# Patient Record
Sex: Male | Born: 1945 | Hispanic: No | Marital: Single | Smoking: Never smoker
Health system: Southern US, Community
[De-identification: ages and names within clinical notes are randomized; demographics above are authoritative.]

## PROBLEM LIST (undated history)

## (undated) DIAGNOSIS — Z800 Family history of malignant neoplasm of digestive organs: Secondary | ICD-10-CM

## (undated) HISTORY — DX: Family history of malignant neoplasm of digestive organs: Z80.0

---

## 2018-06-26 ENCOUNTER — Other Ambulatory Visit (HOSPITAL_COMMUNITY): Payer: Self-pay | Admitting: Family Medicine

## 2018-06-26 ENCOUNTER — Other Ambulatory Visit: Payer: Self-pay | Admitting: Family Medicine

## 2018-06-26 DIAGNOSIS — N289 Disorder of kidney and ureter, unspecified: Secondary | ICD-10-CM

## 2018-06-26 DIAGNOSIS — K869 Disease of pancreas, unspecified: Secondary | ICD-10-CM

## 2018-06-30 ENCOUNTER — Other Ambulatory Visit (HOSPITAL_COMMUNITY): Payer: Self-pay | Admitting: Family Medicine

## 2018-06-30 ENCOUNTER — Ambulatory Visit (HOSPITAL_COMMUNITY)
Admission: RE | Admit: 2018-06-30 | Discharge: 2018-06-30 | Disposition: A | Discharge status: Home / Self Care | Payer: No Typology Code available for payment source | Source: Ambulatory Visit | Attending: Family Medicine | Admitting: Family Medicine

## 2018-06-30 DIAGNOSIS — K869 Disease of pancreas, unspecified: Secondary | ICD-10-CM | POA: Insufficient documentation

## 2018-06-30 DIAGNOSIS — N289 Disorder of kidney and ureter, unspecified: Secondary | ICD-10-CM | POA: Insufficient documentation

## 2018-06-30 MED ORDER — GADOBUTROL 1 MMOL/ML IV SOLN
9.0000 mL | Freq: Once | INTRAVENOUS | Status: AC | PRN
  Administered 2018-06-30: 9 mL via INTRAVENOUS

## 2018-07-08 ENCOUNTER — Ambulatory Visit (HOSPITAL_COMMUNITY)
Admission: RE | Admit: 2018-07-08 | Discharge: 2018-07-08 | Disposition: A | Discharge status: Home / Self Care | Payer: No Typology Code available for payment source | Source: Ambulatory Visit | Attending: Urology | Admitting: Urology

## 2018-07-08 ENCOUNTER — Other Ambulatory Visit (HOSPITAL_COMMUNITY): Payer: Self-pay | Admitting: Urology

## 2018-07-08 DIAGNOSIS — D4101 Neoplasm of uncertain behavior of right kidney: Secondary | ICD-10-CM

## 2018-07-09 ENCOUNTER — Telehealth: Payer: Self-pay | Admitting: Genetic Counselor

## 2018-07-09 NOTE — Telephone Encounter (Signed)
A genetic counseling appt has been scheduled for the pt on 2/3 at 8am. Pt wanted genetic counseling done asap bc he will be having surgery on his kidney. Francisco Mckinney is aware to arrive 15 minutes early to be checked in on time.

## 2018-07-11 ENCOUNTER — Telehealth: Payer: Self-pay | Admitting: Genetics

## 2018-07-11 NOTE — Telephone Encounter (Signed)
Pt cld back to reschedule appt to 4pm on 2/3

## 2018-07-14 ENCOUNTER — Inpatient Hospital Stay: Payer: Non-veteran care | Attending: Oncology | Admitting: Genetics

## 2018-07-14 ENCOUNTER — Other Ambulatory Visit: Payer: Non-veteran care

## 2018-07-14 DIAGNOSIS — Z315 Encounter for genetic counseling: Secondary | ICD-10-CM | POA: Diagnosis not present

## 2018-07-14 DIAGNOSIS — Z7183 Encounter for nonprocreative genetic counseling: Secondary | ICD-10-CM

## 2018-07-14 DIAGNOSIS — Z800 Family history of malignant neoplasm of digestive organs: Secondary | ICD-10-CM | POA: Diagnosis not present

## 2018-07-15 ENCOUNTER — Encounter: Payer: Self-pay | Admitting: Genetics

## 2018-07-15 DIAGNOSIS — Z800 Family history of malignant neoplasm of digestive organs: Secondary | ICD-10-CM | POA: Insufficient documentation

## 2018-07-15 NOTE — Progress Notes (Signed)
REFERRING PROVIDER: Henreitta Cea, MD No address on file  PRIMARY PROVIDER:  [place] VISIT:  1. Family history of pancreatic cancer     HISTORY OF PRESENT ILLNESS:   Francisco Mckinney, a 73 y.o. male, was seen for a [facility] cancer genetics consultation at the request of Dr. Arletha Grippe Mckinney to a family history of cancer.  Francisco Mckinney presents to clinic today to discuss the possibility of a hereditary predisposition to cancer, genetic testing, and to further clarify his future cancer risks, as well as potential cancer risks for family members.   Francisco Mckinney is a 73 y.o. male with no personal history of cancer.  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   Colonoscopy: yes; 2 years ago, no polyps. Mckinney for next in 2022.  Past Medical History:  Diagnosis Date  . Family history of pancreatic cancer   . Family history of pancreatic cancer     Social History   Socioeconomic History  . Marital status: Single    Spouse name: Not on file  . Number of children: Not on file  . Years of education: Not on file  . Highest education level: Not on file  Occupational History  . Not on file  Social Needs  . Financial resource strain: Not on file  . Food insecurity:    Worry: Not on file    Inability: Not on file  . Transportation needs:    Medical: Not on file    Non-medical: Not on file  Tobacco Use  . Smoking status: Not on file  Substance and Sexual Activity  . Alcohol use: Not on file  . Drug use: Not on file  . Sexual activity: Not on file  Lifestyle  . Physical activity:    Days per week: Not on file    Minutes per session: Not on file  . Stress: Not on file  Relationships  . Social connections:    Talks on phone: Not on file    Gets together: Not on file    Attends religious service: Not on file    Active member of club or organization: Not on file    Attends meetings of clubs or  organizations: Not on file    Relationship status: Not on file  Other Topics Concern  . Not on file  Social History Narrative  . Not on file     FAMILY HISTORY:  We obtained a detailed, 4-generation family history.  Significant diagnoses are listed below: Family History  Problem Relation Age of Onset  . Pancreatic cancer Mother 6  . Other Brother        cyst in pancreas  . Pancreatic cancer Maternal Grandmother 5   Francisco Mckinney has a 40 year-old son.  He has 2 brothers (1 is deceased) and 1 sister (62) with no hx of cancer.   Francisco Mckinney father: died at 5 with no hx of cancer.  Paternal Aunts/Uncles: 3 paternal uncles with no hx of cancer.  Paternal cousins: no hx of cancer.  Paternal grandfather: died at 31 Mckinney to the flue.  Paternal grandmother:no hx of cancer.   Francisco Mckinney mother: died at 22 with pancreatic cancer.  Maternal Aunts/Uncles: 1 maternal aunt with no hx of cancer.  Maternal cousins: no hx of cancer (3 cousins) Maternal grandfather: died young, no known hx of cancer.  Maternal grandmother:died in her early 68's with pancreatic  cancer.   Francisco Mckinney is unaware of previous family history of genetic testing for hereditary cancer risks. Patient's maternal ancestors are of Caucasian descent, and paternal ancestors are of [place] descent. There is no reported Ashkenazi Jewish ancestry. There is no known consanguinity.  GENETIC COUNSELING ASSESSMENT: Francisco Mckinney is a 73 y.o. male with a family history which is somewhat suggestive of a Hereditary Cancer Predisposition Syndrome. We, therefore, discussed and recommended the following at today's visit.   DISCUSSION: We reviewed the characteristics, features and inheritance patterns of hereditary cancer syndromes. We also discussed genetic testing, including the appropriate family members to test, the process of testing, insurance coverage and turn-around-time for results. We discussed the implications of a  negative, positive and/or variant of uncertain significant result. We recommended Francisco Mckinney pursue genetic testing for the Multi-Cancer gene panel+ hereditary pancreatitis panel.    The Multi-Cancer Panel offered by [facility] includes sequencing and/or deletion duplication testing of the following 90 genes: AIP, ALK, APC, ATM, AXIN2, BAP1, BARD1, BLM, BMPR1A, BRCA1, BRCA2, BRIP1, BUB1B, CASR, CDC73, CDH1, CDK4, CDKN1B, CDKN1C, CDKN2A, CEBPA, CHEK2, CTNNA1, DICER1, DIS3L2, EGFR, ENG, EPCAM, FH, FLCN, GALNT12, GATA2, GPC3, GREM1, HOXB13, HRAS, KIT, MAX, MEN1, MET, MITF, MLH1, MLH3, MSH2, MSH3, MSH6, MUTYH, NBN, NF1, NF2, NTHL1, PALB2, PDGFRA, PHOX2B, PMS2, POLD1, POLE, POT1, PRKAR1A, PTCH1, PTEN, RAD50, RAD51C, RAD51D, RB1, RECQL4, RET, RNF43, RPS20, RUNX1, SDHA, SDHAF2, SDHB, SDHC, SDHD, SMAD4, SMARCA4, SMARCB1, SMARCE1, STK11, SUFU, TERC, TERT, TMEM127, TP53, TSC1, TSC2, VHL, WRN, WT1  Chronic Pancreatitis Panel: CASR CFTR CPA1 CTRC PRSS1 SPINK1   We discussed that only 5-10% of cancers are associated with a Hereditary cancer predisposition syndrome.  There are many different cancer predisposition syndromes caused by mutations in several different cancer genes.    We discussed hereditary pancreatic cancer and pancreatic cancer screening options.   We discussed that if he is found to have a mutation in one of these genes, it may impact future medical management recommendations such as increased cancer screenings and consideration of risk reducing surgeries.  A positive result could also have implications for the patient's family members.  A Negative result would mean we did not identify a heredity predisposition to cancer syndrome, but does not rule out the possibility of a hereditary risk for cancer.  There could be mutations that are undetectable by current technology, or in genes not yet tested or identified to increase cancer risk.    We discussed the potential to find a Variant of Uncertain  Significance or VUS.  These are variants that have not yet been identified as pathogenic or benign, and it is unknown if this variant is associated with increased cancer risk or if this is a normal finding.  Most VUS's are reclassified to benign or likely benign.   It should not be used to make medical management decisions. With time, we suspect the lab will determine the significance of any VUS's identified if any.   Based on Francisco Mckinney family history of cancer, it is reasonable to perform genetic testing.  He may still have an out of pocket cost. The laboratory can provide him with an estimate of his OOP cost.  he was given the contact information for the laboratory if he has further questions. .   In order to estimate his chance of having a genetic mutation, we used the statistical model Company secretary) This number must be considered a rough estimate and not a precise risk of having a a genetic mutation or pancreatic cancer.  This model estimates his mutation probability is 26.85% and his lifetime risk to develop pancreatic cancer is 3.49%.    Based on this >20% risk for a mutation, genetic testing is indicated.      PLAN: After considering the risks, benefits, and limitations, Francisco Mckinney  provided informed consent to pursue genetic testing and the blood sample was sent to [facility] for analysis of the Multi-Cancer Panel + Pancreatitis panel. Results should be available within approximately 2-3 weeks' time, at which point they will be disclosed by telephone to Francisco Mckinney, as will any additional recommendations warranted by these results. Francisco Mckinney will receive a summary of his genetic counseling visit and a copy of his results once available. This information will also be available in Epic. We encouraged Francisco Mckinney to remain in contact with cancer genetics annually so that we can continuously update the family history and inform him of any changes in cancer genetics and testing  that may be of benefit for his family. Francisco Mckinney questions were answered to his satisfaction today. Our contact information was provided should additional questions or concerns arise.  Based on Francisco Mckinney family history, we recommended his siblings/maternal relatives could also consider genetic counseling and testing. Francisco Mckinney will let us know if we can be of any assistance in coordinating genetic counseling and/or testing for this family member.   Lastly, we encouraged Francisco Mckinney to remain in contact with cancer genetics annually so that we can continuously update the family history and inform him of any changes in cancer genetics and testing that may be of benefit for this family.   Mr.  Hershberger questions were answered to his satisfaction today. Our contact information was provided should additional questions or concerns arise. Thank you for the referral and allowing [place] to share in the care of your patient.   Tana Felts, MS, [facility] Certified Genetic Counselor [email or web address] phone: [phone]  The patient was seen for a total of 30 minutes in face-to-face genetic counseling.  The patient was accompanied today by his wife.  This patient was discussed with Drs. Magrinat, Lindi Adie and/or Burr Medico who agrees with the above.

## 2018-07-18 ENCOUNTER — Other Ambulatory Visit: Payer: Self-pay | Admitting: Urology

## 2018-07-23 ENCOUNTER — Telehealth: Payer: Self-pay | Admitting: Genetics

## 2018-07-25 ENCOUNTER — Encounter: Payer: Self-pay | Admitting: Genetics

## 2018-07-25 ENCOUNTER — Ambulatory Visit: Payer: Self-pay | Admitting: Genetics

## 2018-07-25 DIAGNOSIS — Z1379 Encounter for other screening for genetic and chromosomal anomalies: Secondary | ICD-10-CM

## 2018-07-25 DIAGNOSIS — Z800 Family history of malignant neoplasm of digestive organs: Secondary | ICD-10-CM

## 2018-07-25 NOTE — Progress Notes (Signed)
HPI:  Francisco Mckinney was previously seen in the [facility] clinic on 07/14/2018 due to a family history of pancreatic cancer and concerns regarding a hereditary predisposition to cancer. Please refer to our prior cancer genetics clinic note for more information regarding Francisco Mckinney medical, social and family histories, and our assessment and recommendations, at the time. Francisco Mckinney recent genetic test results were disclosed to him, as well as recommendations warranted by these results. These results and recommendations are discussed in more detail below.  CANCER HISTORY:  Francisco Mckinney is a 73 y.o. male with no personal history of cancer.  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    FAMILY HISTORY:  We obtained a detailed, 4-generation family history.  Significant diagnoses are listed below: Family History  Problem Relation Age of Onset  . Pancreatic cancer Mother 77  . Other Brother        cyst in pancreas  . Pancreatic cancer Maternal Grandmother 41    Francisco Mckinney has a 50 year-old son.  He has 2 brothers (1 is deceased) and 1 sister (32) with no hx of cancer.   Francisco Mckinney father: died at 42 with no hx of cancer.  Paternal Aunts/Uncles: 3 paternal uncles with no hx of cancer.  Paternal cousins: no hx of cancer.  Paternal grandfather: died at 66 due to the flue.  Paternal grandmother:no hx of cancer.   Francisco Mckinney mother: died at 14 with pancreatic cancer.  Maternal Aunts/Uncles: 1 maternal aunt with no hx of cancer.  Maternal cousins: no hx of cancer (3 cousins) Maternal grandfather: died young, no known hx of cancer.  Maternal grandmother:died in her early 58's with pancreatic cancer.   Francisco Mckinney is unaware of previous family history of genetic testing for hereditary cancer risks. Patient's maternal ancestors are of Caucasian descent, and paternal ancestors are of [place]  descent. There is no reported Ashkenazi Jewish ancestry. There is no known consanguinity.  GENETIC TEST RESULTS: Genetic testing performed through [facility]'s Multi-cancer  reported out on 07/22/2018 showed no pathogenic mutations.  Genes analyzed AIP, ALK, APC, ATM, AXIN2, BAP1, BARD1, BLM, BMPR1A, BRCA1, BRCA2, BRIP1, BUB1B, CASR,CDC73, CDH1, CDK4, CDKN1B, CDKN1C, CDKN2A (p14ARF), CDKN2A (p16INK4a), CEBPA, CFTR, CHEK2, CPA1, CTNNA1, CTRC, DICER1, DIS3L2, EGFR, ENG, EPCAM*, FH, FLCN, GALNT12, GATA2,GPC3, GREM1*, HOXB13, HRAS, KIT, MAX, MEN1, MET, MITF*, MLH1, MLH3, MSH2, MSH3,MSH6, MUTYH, NBN, NF1, NF2, NTHL1, PALB2,PDGFRA, PHOX2B*, PMS2, POLD1, POLE, POT1, PRKAR1A, PRSS1, PTCH1, PTEN, RAD50, RAD51C, RAD51D, RB1, RECQL4, RET, RNF43, RPS20,RUNX1, SDHA*, SDHAF2, SDHB, SDHC, SDHD, SMAD4, SMARCA4, SMARCB1, SMARCE1, SPINK1,STK11, SUFU, TERC, TERT, TMEM127, TP53, TSC1, TSC2, VHL, WRN*, WT1.  A variant of uncertain significance (VUS) in a gene called ALK was also noted. c.3115G>C (p.[id number]). A variant of uncertain significance (VUS) in a gene called BUB1B was also noted. c.[id number]>G (p.Ile625Val)  The test report will be scanned into EPIC and will be located under the Molecular Pathology section of the Results Review tab. A portion of the Mckinney report is included below for reference.     We discussed with Francisco Mckinney that because current genetic testing is not perfect, it is possible there may be a gene mutation in one of these genes that current testing cannot detect, but that chance is small.  We also discussed, that there could be another gene that has not yet been discovered, or that we have not yet tested, that is responsible for the cancer diagnoses in  the family. It is also possible there is a hereditary cause for the cancer in the family that Francisco Mckinney did not inherit and therefore was not identified in his testing.  Therefore, it is important to remain in touch with cancer genetics in the  future so that we can continue to offer Francisco Mckinney the most up to date genetic testing.   Regarding the VUS's in ALK and BUB1B: At this time, it is unknown if these variants are associated with increased cancer risk or if they are normal findings, but most variants such as these get reclassified to being inconsequential. They should not be used to make medical management decisions. With time, we suspect the lab will determine the significance of these variants, if any. If we do learn more about them, we will try to contact Francisco Mckinney to discuss it further. However, it is important to stay in touch with [place] periodically and keep the address and phone number up to date.  ADDITIONAL GENETIC TESTING: We discussed with Francisco Mckinney that his genetic testing was fairly extensive.  If there are are genes identified to increase cancer risk that can be analyzed in the future, we would be happy to discuss and coordinate this testing at that time.    CANCER SCREENING RECOMMENDATIONS: Francisco Mckinney is considered negative (normal).  This means that we have not identified a hereditary predisposition to cancer in him at this time.   While reassuring, this does not definitively rule out a hereditary predisposition to cancer. It is still possible that there could be genetic mutations that are undetectable by current technology, or genetic mutations in genes that have not been tested or identified to increase cancer risk.  Therefore, it is recommended he continue to follow the cancer management and screening guidelines provided by his oncology and primary healthcare provider. An individual's cancer risk is not determined by genetic test results alone.  Overall cancer risk assessment includes additional factors such as personal medical history, family history, etc.  These should be used to make a personalized plan for cancer prevention and surveillance.    We discussed he should continue to follow his  doctors' recommendations regarding evaluation/monitoring of his renal mass.  He understands just because there is not genetic predisposition to cancer detected, he can still get cancer.    RECOMMENDATIONS FOR FAMILY MEMBERS:  Relatives in this family might be at some increased risk of developing cancer, over the general population risk, simply due to the family history of cancer.  We recommended women in this family have a yearly mammogram beginning at age 39, or 43 years younger than the earliest onset of cancer, an annual clinical breast exam, and perform monthly breast self-exams. Women in this family should also have a gynecological exam as recommended by their primary provider. All family members should have a colonoscopy by age 57 (or as directed by their doctors).  All family members should inform their physicians about the family history of cancer so their doctors can make the most appropriate screening recommendations for them.   It is also possible there is a hereditary cause for the cancer in Mr. Westberg family that he did not inherit and therefore was not identified in him.  Therefore, we recommended maternal relatives also consider genetic counseling and testing. Mr. Cratty will let us know if we can be of any assistance in coordinating genetic counseling and/or testing for these family members.   FOLLOW-UP: Lastly, we discussed with Mr. Dutton that  cancer genetics is a rapidly advancing field and it is possible that new genetic tests will be appropriate for him and/or his family members in the future. We encouraged him to remain in contact with cancer genetics on an annual basis so we can update his personal and family histories and let him know of advances in cancer genetics that may benefit this family.   Our contact number was provided. Mr. Carden questions were answered to his satisfaction, and he knows he is welcome to call us at anytime with additional questions or  concerns.   Ferol Luz, MS, [facility] Certified Genetic Counselor [email or web address]

## 2018-07-25 NOTE — Telephone Encounter (Signed)
Revealed negative genetic testing.  Revealed that a VUS's in ALK and BUB1B were identified.   This normal result is reassuring and indicates that it is unlikely Francisco Mckinney has a hereditary predisposition to cancer syndrome.  However, genetic testing is not perfect, and cannot definitively rule out a hereditary cause.  It will be important for him to keep in contact with genetics to learn if any additional testing may be needed in the future.     Recommended he continue to follow his urologist's and other doctors' recommendations regarding magement/evaluation of his renal mass. We also explained his family history of cancer does still impact his risk.    Other relatives could consider testing as there could still be a genetic mutation in his family that he did not inherit and therefore was not found in him.

## 2018-09-03 ENCOUNTER — Inpatient Hospital Stay: Admit: 2018-09-03 | Payer: Non-veteran care | Admitting: Urology

## 2018-09-03 SURGERY — NEPHRECTOMY, PARTIAL, ROBOT-ASSISTED
Anesthesia: General | Laterality: Right

## 2019-12-29 ENCOUNTER — Other Ambulatory Visit: Payer: Self-pay

## 2019-12-29 ENCOUNTER — Encounter (HOSPITAL_COMMUNITY): Payer: Self-pay | Admitting: Pharmacy Technician

## 2019-12-29 ENCOUNTER — Emergency Department (HOSPITAL_COMMUNITY)
Admission: EM | Admit: 2019-12-29 | Discharge: 2019-12-29 | Disposition: A | Discharge status: Home / Self Care | Payer: No Typology Code available for payment source | Attending: Emergency Medicine | Admitting: Emergency Medicine

## 2019-12-29 DIAGNOSIS — T63484A Toxic effect of venom of other arthropod, undetermined, initial encounter: Secondary | ICD-10-CM

## 2019-12-29 DIAGNOSIS — R55 Syncope and collapse: Secondary | ICD-10-CM | POA: Diagnosis present

## 2019-12-29 DIAGNOSIS — T63441A Toxic effect of venom of bees, accidental (unintentional), initial encounter: Secondary | ICD-10-CM | POA: Insufficient documentation

## 2019-12-29 LAB — BASIC METABOLIC PANEL
Anion gap: 9 (ref 5–15)
BUN: 19 mg/dL (ref 8–23)
CO2: 24 mmol/L (ref 22–32)
Calcium: 9 mg/dL (ref 8.9–10.3)
Chloride: 107 mmol/L (ref 98–111)
Creatinine, Ser: 1.3 mg/dL — ABNORMAL HIGH (ref 0.61–1.24)
GFR calc Af Amer: >60 mL/min (ref >60)
GFR calc non Af Amer: 54 mL/min — ABNORMAL LOW (ref >60)
Glucose, Bld: 152 mg/dL — ABNORMAL HIGH (ref 70–99)
Potassium: 4.2 mmol/L (ref 3.5–5.1)
Sodium: 140 mmol/L (ref 135–145)

## 2019-12-29 LAB — CBC WITH DIFFERENTIAL/PLATELET
Abs Immature Granulocytes: 0.02 10*3/uL (ref 0.00–0.07)
Basophils Absolute: 0 10*3/uL (ref 0.0–0.1)
Basophils Relative: 0 %
Eosinophils Absolute: 0.1 10*3/uL (ref 0.0–0.5)
Eosinophils Relative: 3 %
HCT: 40.1 % (ref 39.0–52.0)
Hemoglobin: 13.2 g/dL (ref 13.0–17.0)
Immature Granulocytes: 1 %
Lymphocytes Relative: 23 %
Lymphs Abs: 1 10*3/uL (ref 0.7–4.0)
MCH: 31.8 pg (ref 26.0–34.0)
MCHC: 32.9 g/dL (ref 30.0–36.0)
MCV: 96.6 fL (ref 80.0–100.0)
Monocytes Absolute: 0.2 10*3/uL (ref 0.1–1.0)
Monocytes Relative: 4 %
Neutro Abs: 2.9 10*3/uL (ref 1.7–7.7)
Neutrophils Relative %: 69 %
Platelets: 216 10*3/uL (ref 150–400)
RBC: 4.15 MIL/uL — ABNORMAL LOW (ref 4.22–5.81)
RDW: 13.5 % (ref 11.5–15.5)
WBC: 4.3 10*3/uL (ref 4.0–10.5)
nRBC: 0 % (ref 0.0–0.2)

## 2019-12-29 NOTE — ED Provider Notes (Signed)
[facility] EMERGENCY DEPARTMENT Provider Note   CSN: [id number] Arrival date & time: 12/29/19  1024     History Chief Complaint  Patient presents with  . Near Syncope    Francisco Mckinney is a 74 y.o. male.  HPI   Patient presents the emergency room for near syncopal episode after a bee sting.  Patient was doing yard work.  He was stung by a bee.  After the sting he started feeling lightheaded.  He felt like he was going to pass out.  EMS was called.  Patient does have history of prior allergic reactions to a wasp sting that required hospitalization.  Patient has not noticed any hives.  He is not having any itching.  He is not having any difficulty breathing.  Right now he is feeling fine.  EMS was called and they transported him to the ED for evaluation.  Patient does have history of cardiac disease.  He does have 2 cardiac stents.  He is not having any issues with chest pain or shortness of breath.  Past Medical History:  Diagnosis Date  . Family history of pancreatic cancer   . Family history of pancreatic cancer     Patient Active Problem List   Diagnosis Date Noted  . Genetic testing 07/25/2018  . Family history of pancreatic cancer     History reviewed. No pertinent surgical history.     Family History  Problem Relation Age of Onset  . Pancreatic cancer Mother 59  . Other Brother        cyst in pancreas  . Pancreatic cancer Maternal Grandmother 73    Social History   Tobacco Use  . Smoking status: Not on file  Substance Use Topics  . Alcohol use: Not on file  . Drug use: Not on file    Home Medications Prior to Admission medications   Not on File    Allergies    Antihistamines, diphenhydramine-type and Wasp venom  Review of Systems   Review of Systems  All other systems reviewed and are negative.   Physical Exam Updated Vital Signs BP (!) 119/58   Pulse (!) 47   Temp 98 F (36.7 C) (Temporal)   Resp 13   SpO2 97%    Physical Exam Vitals and nursing note reviewed.  Constitutional:      General: He is not in acute distress.    Appearance: He is well-developed.  HENT:     Head: Normocephalic and atraumatic.     Right Ear: External ear normal.     Left Ear: External ear normal.  Eyes:     General: No scleral icterus.       Right eye: No discharge.        Left eye: No discharge.     Conjunctiva/sclera: Conjunctivae normal.  Neck:     Trachea: No tracheal deviation.  Cardiovascular:     Rate and Rhythm: Normal rate and regular rhythm.  Pulmonary:     Effort: Pulmonary effort is normal. No respiratory distress.     Breath sounds: Normal breath sounds. No stridor. No wheezing or rales.  Abdominal:     General: Bowel sounds are normal. There is no distension.     Palpations: Abdomen is soft.     Tenderness: There is no abdominal tenderness. There is no guarding or rebound.  Musculoskeletal:        General: No tenderness.     Cervical back: Neck supple.  Skin:  General: Skin is warm and dry.     Findings: No rash.  Neurological:     Mental Status: He is alert.     Cranial Nerves: No cranial nerve deficit (no facial droop, extraocular movements intact, no slurred speech).     Sensory: No sensory deficit.     Motor: No abnormal muscle tone or seizure activity.     Coordination: Coordination normal.     ED Results / Procedures / Treatments   Labs (all labs ordered are listed, but only abnormal results are displayed) Labs Reviewed  CBC WITH DIFFERENTIAL/PLATELET - Abnormal; Notable for the following components:      Result Value   RBC 4.15 (*)    All other components within normal limits  BASIC METABOLIC PANEL - Abnormal; Notable for the following components:   Glucose, Bld 152 (*)    Creatinine, Ser 1.30 (*)    GFR calc non Af Amer 54 (*)    All other components within normal limits    EKG EKG Interpretation  Date/Time:  Tuesday December 29 2019 10:29:53 EDT Ventricular Rate:   51 PR Interval:    QRS Duration: 116 QT Interval:  446 QTC Calculation: 411 R Axis:   24 Text Interpretation: Sinus rhythm Paired ventricular premature complexes Prolonged PR interval Nonspecific intraventricular conduction delay Nonspecific T abnormalities, lateral leads No old tracing to compare Confirmed by Dorie Rank [phone]) on 12/29/2019 10:42:54 AM   Radiology No results found.  Procedures Procedures (including critical care time)  Medications Ordered in ED Medications - No data to display  ED Course  I have reviewed the triage vital signs and the nursing notes.  Pertinent labs & imaging results that were available during my care of the patient were reviewed by me and considered in my medical decision making (see chart for details).  Clinical Course as of Dec 29 1226  Tue Dec 29, 2019  1211 Labs reviewed.  Creatinine slightly higher compared to baseline.   [JK]  1213 Care everywhere reviewed.  Baseline creatinine is 1.2   [JK]  1213 CBC stable   [JK]    Clinical Course User Index [JK] Dorie Rank, MD   MDM Rules/Calculators/A&P                          Patient presented to the ED for evaluation of a syncopal episode after an insect sting.  Patient had prior history of anaphylactic type reaction but no evidence of that here today.  He is not having any hives.  No difficulty breathing.  Patient was monitored in the ED.  He was asymptomatic.  Bradycardia noted but I do not think this is clinically significant.  He remained normotensive.  Slight increase in his creatinine.  Encouraged p.o. fluid intake. Final Clinical Impression(s) / ED Diagnoses Final diagnoses:  Near syncope  Insect stings, undetermined intent, initial encounter    Rx / DC Orders ED Discharge Orders    None       Dorie Rank, MD 12/29/19 1228

## 2019-12-29 NOTE — ED Triage Notes (Signed)
Pt bib ems from home where he was doing yard work and stung by a bee. 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 After being stung, pt had near syncopal event. Hx cardiac stent X2. No obvious allergic reaction noted. VSS with EMS.

## 2019-12-29 NOTE — ED Notes (Signed)
Pt d/c home per MD order. Discharge summary reviewed, pt verbalizes understanding. Voicing no complaints at this time. No s/s of acute distress noted. Off unit via [employer]- Discharged home with son.

## 2019-12-29 NOTE — Discharge Instructions (Addendum)
Make sure to drink plenty of fluids.  Your creatinine was slightly higher than your baseline.  This can be associated with dehydration which may have contributed to the episode today.  Follow-up with your primary care doctor regular doctor to be rechecked.  Return as needed for worsening symptoms.

## 2020-03-17 ENCOUNTER — Emergency Department (HOSPITAL_COMMUNITY)
Admission: EM | Admit: 2020-03-17 | Discharge: 2020-03-17 | Disposition: A | Discharge status: Home / Self Care | Payer: Medicare Other | Attending: Emergency Medicine | Admitting: Emergency Medicine

## 2020-03-17 ENCOUNTER — Encounter (HOSPITAL_COMMUNITY): Payer: Self-pay

## 2020-03-17 ENCOUNTER — Other Ambulatory Visit: Payer: Self-pay

## 2020-03-17 DIAGNOSIS — M79662 Pain in left lower leg: Secondary | ICD-10-CM | POA: Diagnosis present

## 2020-03-17 DIAGNOSIS — L0390 Cellulitis, unspecified: Secondary | ICD-10-CM | POA: Diagnosis not present

## 2020-03-17 MED ORDER — CEPHALEXIN 500 MG PO CAPS
500.0000 mg | ORAL_CAPSULE | Freq: Once | ORAL | Status: AC
  Administered 2020-03-17: 500 mg via ORAL
  Filled 2020-03-17: qty 1

## 2020-03-17 MED ORDER — CEPHALEXIN 500 MG PO CAPS: 500.0000 mg | ORAL_CAPSULE | Freq: Three times a day (TID) | ORAL | 0 refills | Status: AC

## 2020-03-17 NOTE — Discharge Instructions (Addendum)
You were evaluated in the Emergency Department and after careful evaluation, we did not find any emergent condition requiring admission or further testing in the hospital.  Your exam/testing today was overall reassuring.  Your symptoms seem to be due to cellulitis.  However, blood clot in the leg is still possible.  Please return tomorrow morning for an ultrasound of the leg.  Please return to the Emergency Department if you experience any worsening of your condition such as worsening pain, fevers, chest pain, shortness of breath.  Thank you for allowing [place] to be a part of your care.

## 2020-03-17 NOTE — ED Triage Notes (Signed)
Patient arrived stating around 4pm his left lower calf began hurting. Patient ambulatory in triage. States he had a fever prior to arrival, took Tylenol.

## 2020-03-17 NOTE — ED Provider Notes (Signed)
[facility] Emergency Department Provider Note MRN:  [id number]  Arrival date & time: 03/17/20     Chief Complaint   Leg Pain   History of Present Illness   Francisco Mckinney is a 74 y.o. year-old male with no pertinent past medical history presenting to the ED with chief complaint of leg pain.  Sudden onset left calf pain today at 4 PM, initially 8 or 9 out of 10 in severity, has slowly improved to 2 out of 10 in severity.  The left leg feels warm, tender to the touch, slightly erythematous.  Also reporting fever up to 101 today.  Denies any trauma, no chest pain, no shortness of breath, no shaking chills, no headache, no abdominal pain, no nausea vomiting.  Review of Systems  A complete 10 system review of systems was obtained and all systems are negative except as noted in the HPI and PMH.   Patient's Health History    Past Medical History:  Diagnosis Date  . Family history of pancreatic cancer   . Family history of pancreatic cancer     History reviewed. No pertinent surgical history.  Family History  Problem Relation Age of Onset  . Pancreatic cancer Mother 43  . Other Brother        cyst in pancreas  . Pancreatic cancer Maternal Grandmother 73    Social History   Socioeconomic History  . Marital status: Single    Spouse name: Not on file  . Number of children: Not on file  . Years of education: Not on file  . Highest education level: Not on file  Occupational History  . Not on file  Tobacco Use  . Smoking status: Not on file  Substance and Sexual Activity  . Alcohol use: Not on file  . Drug use: Not on file  . Sexual activity: Not on file  Other Topics Concern  . Not on file  Social History Narrative  . Not on file   Social Determinants of Health   Financial Resource Strain:   . Difficulty of Paying Living Expenses: Not on file  Food Insecurity:   . Worried About Charity fundraiser in the Last Year: Not on file  . Ran Out of  Food in the Last Year: Not on file  Transportation Needs:   . Lack of Transportation (Medical): Not on file  . Lack of Transportation (Non-Medical): Not on file  Physical Activity:   . Days of Exercise per Week: Not on file  . Minutes of Exercise per Session: Not on file  Stress:   . Feeling of Stress : Not on file  Social Connections:   . Frequency of Communication with Friends and Family: Not on file  . Frequency of Social Gatherings with Friends and Family: Not on file  . Attends Religious Services: Not on file  . Active Member of Clubs or Organizations: Not on file  . Attends Archivist Meetings: Not on file  . Marital Status: Not on file  Intimate Partner Violence:   . Fear of Current or Ex-Partner: Not on file  . Emotionally Abused: Not on file  . Physically Abused: Not on file  . Sexually Abused: Not on file     Physical Exam   Vitals:   03/17/20 2143  BP: 130/81  Pulse: 81  Resp: 16  Temp: 99.7 F (37.6 C)  SpO2: 94%    CONSTITUTIONAL: Well-appearing, NAD NEURO:  Alert and oriented x 3, no focal  deficits EYES:  eyes equal and reactive ENT/NECK:  no LAD, no JVD CARDIO: Regular rate, well-perfused, normal S1 and S2 PULM:  CTAB no wheezing or rhonchi GI/GU:  normal bowel sounds, non-distended, non-tender MSK/SPINE:  No gross deformities, scant edema to bilateral lower extremities, increased warmth to the left calf and leg, mild tenderness palpation to the left calf SKIN:  no rash, atraumatic PSYCH:  Appropriate speech and behavior  *Additional and/or pertinent findings included in MDM below  Diagnostic and Interventional Summary    EKG Interpretation  Date/Time:    Ventricular Rate:    PR Interval:    QRS Duration:   QT Interval:    QTC Calculation:   R Axis:     Text Interpretation:        Labs Reviewed - No data to display  LE VENOUS    (Results Pending)    Medications  cephALEXin (KEFLEX) capsule 500 mg (has no administration in time  range)     Procedures  /  Critical Care Procedures  ED Course and Medical Decision Making  I have reviewed the triage vital signs, the nursing notes, and pertinent available records from the EMR.  Listed above are laboratory and imaging tests that I personally ordered, reviewed, and interpreted and then considered in my medical decision making (see below for details).  Normal range of motion of all joints, nothing to suggest septic joint.  Exam could be due to cellulitis versus DVT, given the reported fever this would favor more of a cellulitis diagnosis.  Nothing to suggest deep space infection or osteomyelitis.  Will treat for cellulitis as well as schedule ultrasound for tomorrow morning.  If positive, would need prescription for anticoagulation.       Barth Kirks. Sedonia Small, [place] [email or web address]  Final Clinical Impressions(s) / ED Diagnoses     ICD-10-CM   1. Cellulitis, unspecified cellulitis site  L03.90   2. Pain of left calf  M79.662     ED Discharge Orders         Ordered    LE VENOUS        03/17/20 2310    cephALEXin (KEFLEX) 500 MG capsule  3 times daily        03/17/20 2316           Discharge Instructions Discussed with and Provided to Patient:     Discharge Instructions     You were evaluated in the Emergency Department and after careful evaluation, we did not find any emergent condition requiring admission or further testing in the hospital.  Your exam/testing today was overall reassuring.  Your symptoms seem to be due to cellulitis.  However, blood clot in the leg is still possible.  Please return tomorrow morning for an ultrasound of the leg.  Please return to the Emergency Department if you experience any worsening of your condition such as worsening pain, fevers, chest pain, shortness of breath.  Thank you for allowing [place] to be a part of your care.       Maudie Flakes, MD 03/17/20  [phone]

## 2020-03-18 ENCOUNTER — Emergency Department (HOSPITAL_BASED_OUTPATIENT_CLINIC_OR_DEPARTMENT_OTHER)
Admission: RE | Admit: 2020-03-18 | Discharge: 2020-03-18 | Disposition: A | Discharge status: Home / Self Care | Payer: Medicare Other | Source: Ambulatory Visit | Attending: Emergency Medicine | Admitting: Emergency Medicine

## 2020-03-18 DIAGNOSIS — M79662 Pain in left lower leg: Secondary | ICD-10-CM | POA: Diagnosis not present

## 2020-03-18 DIAGNOSIS — M79609 Pain in unspecified limb: Secondary | ICD-10-CM

## 2020-03-18 DIAGNOSIS — M7989 Other specified soft tissue disorders: Secondary | ICD-10-CM | POA: Diagnosis not present

## 2020-06-13 IMAGING — MR MR ABDOMEN WO/W CM
9 of 21 series · 21 of 48 positions shown · IV contrast (gadavist)
Comparison: None.

CLINICAL DATA: Pancreatic and right renal lesions seen on previous
outside CT.

EXAM:
MRI ABDOMEN WITHOUT AND WITH CONTRAST (INCLUDING MRCP)
TECHNIQUE: Multiplanar multisequence MR imaging of the abdomen was performed
both before and after the administration of intravenous contrast.
Heavily T2-weighted images of the biliary and pancreatic ducts were
obtained, and three-dimensional MRCP images were rendered by post
processing.
CONTRAST:  9 mL Gadavist

[Series 3: T2 fat-sat · axial · 5.0mm · 0.90mm/px · z∈[-114,+176]mm · 2 of 59 slices shown]
[im 1/59]
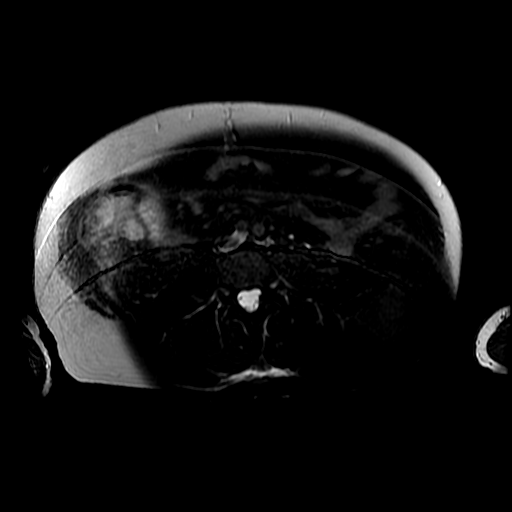
[im 59/59]
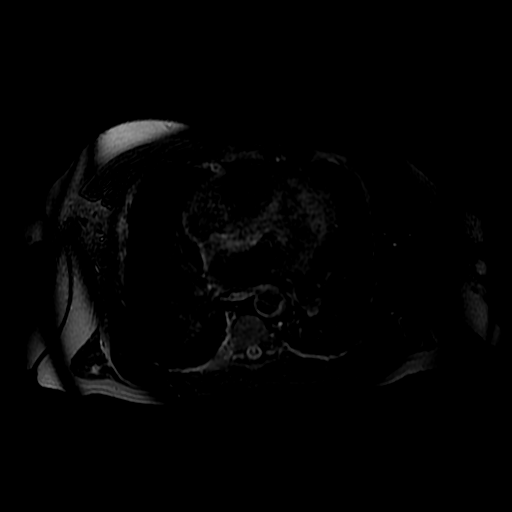

[Series 4: DWI b500 · axial · 6.0mm · 1.80mm/px · z∈[-103,+185]mm · 3 of 75 slices shown]
[im 1/75]
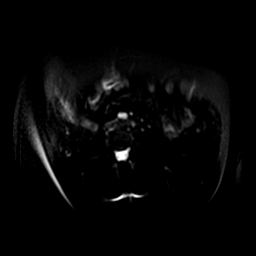
[im 38/75]
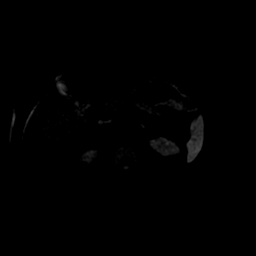
[im 75/75]
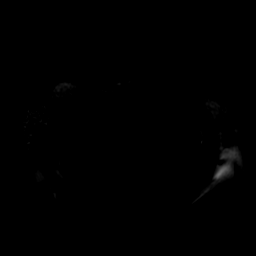

[Series 6: T2 · axial · 5.0mm · 0.90mm/px · z∈[-114,+176]mm · 2 of 59 slices shown (1 of 2)]
[im 1/59]
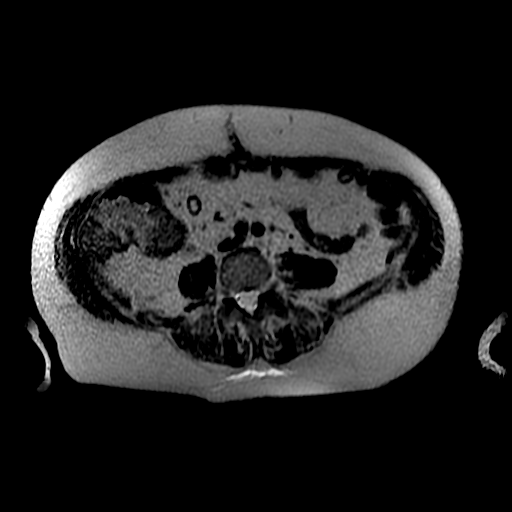
[im 59/59]
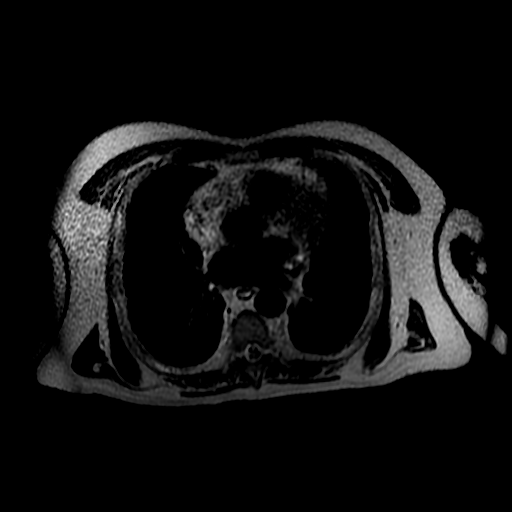

[Series 7: bSSFP · coronal · 5.0mm · 0.86mm/px · 1 of 55 slices shown]
[im 1/55]
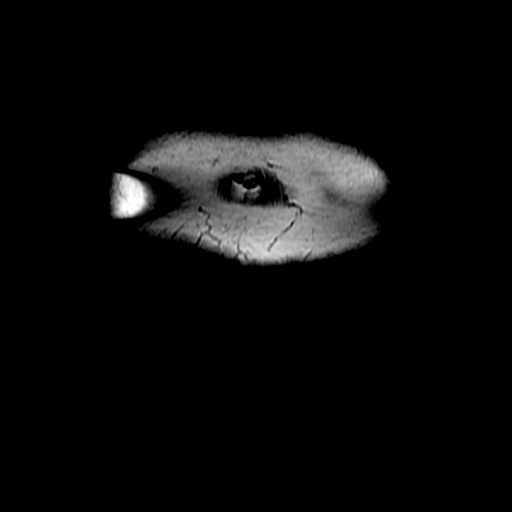

[Series 8: T2 · coronal · 5.0mm · 0.92mm/px · 1 of 55 slices shown (2 of 2)]
[im 1/55]
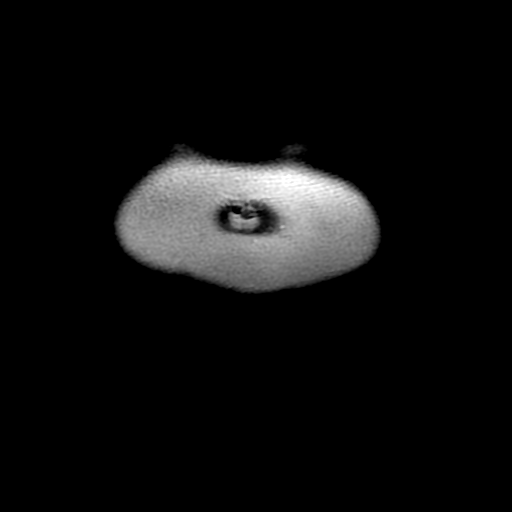

[Series 9: ax dualecho bh · axial · 5.0mm · 0.90mm/px · z∈[-114,+176]mm · 3 of 118 slices shown]
[im 1/118]
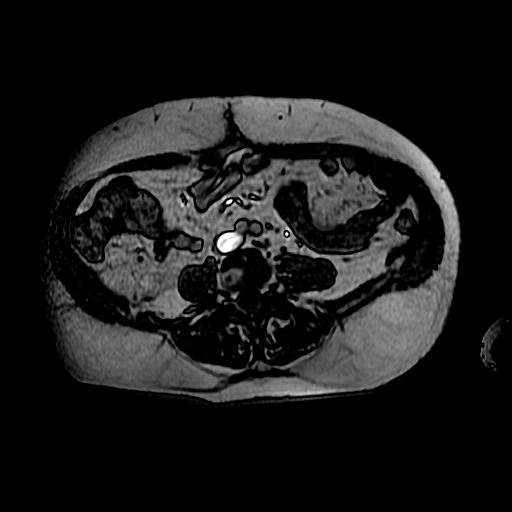
[im 59/118]
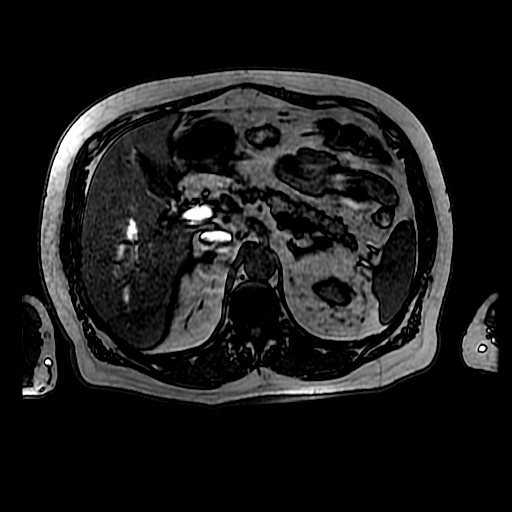
[im 118/118]
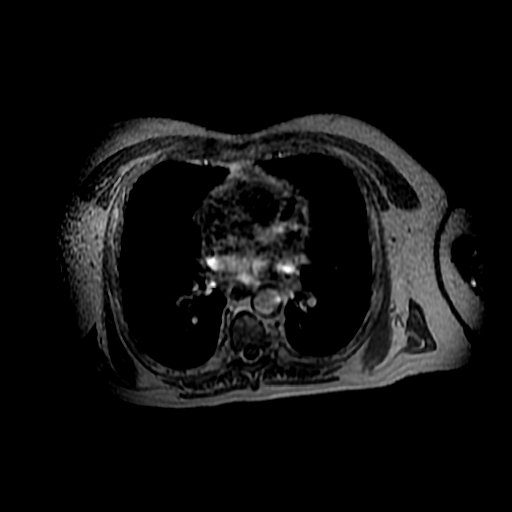

[Series 400: DWI · axial · 6.0mm · 1.80mm/px · 1 of 38 slices shown]
[im 1/38]
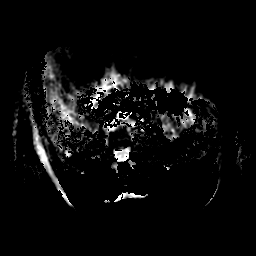

[Series 500: reformatted · axial · 1.8mm · 0.62mm/px · z∈[+30,+160]mm · 4 of 147 slices shown (1 of 2)]
[im 1/147]
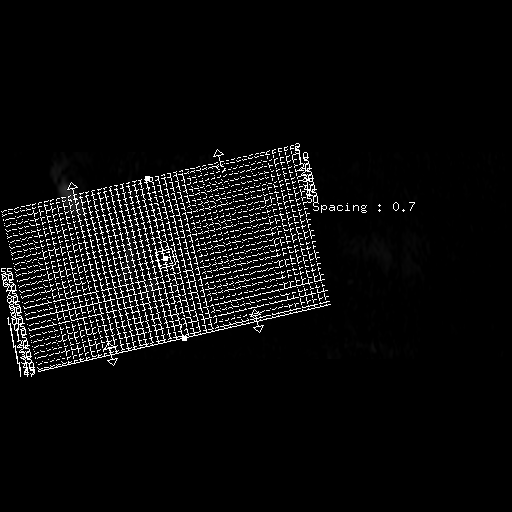
[im 49/147]
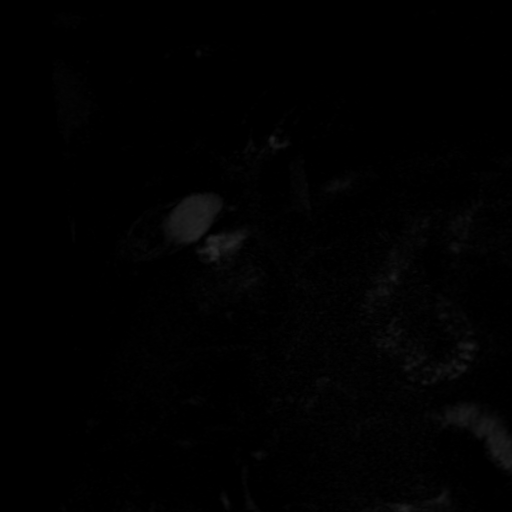
[im 98/147]
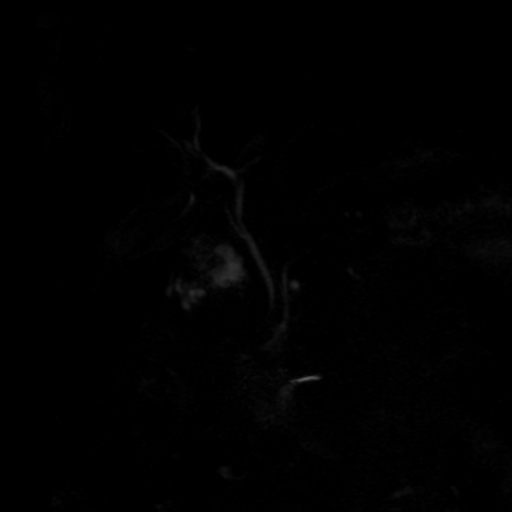
[im 147/147]
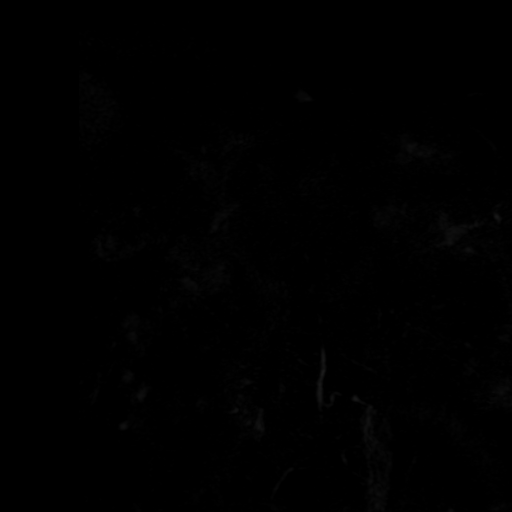

[Series 501: reformatted · axial · 1.8mm · 0.62mm/px · z∈[+30,+160]mm · 4 of 181 slices shown (2 of 2)]
[im 1/181]
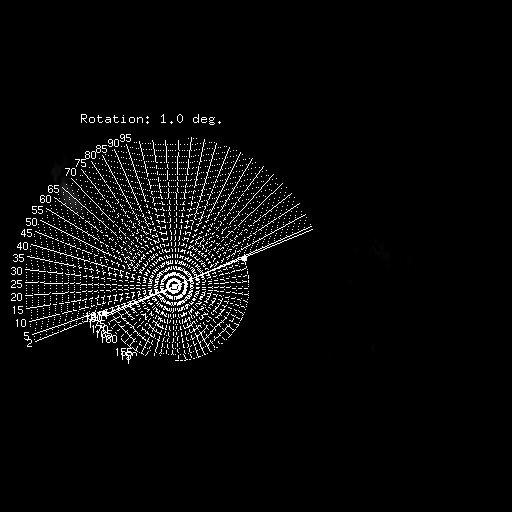
[im 61/181]
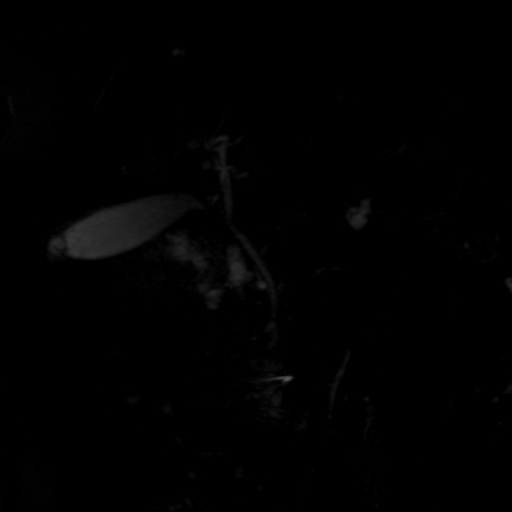
[im 121/181]
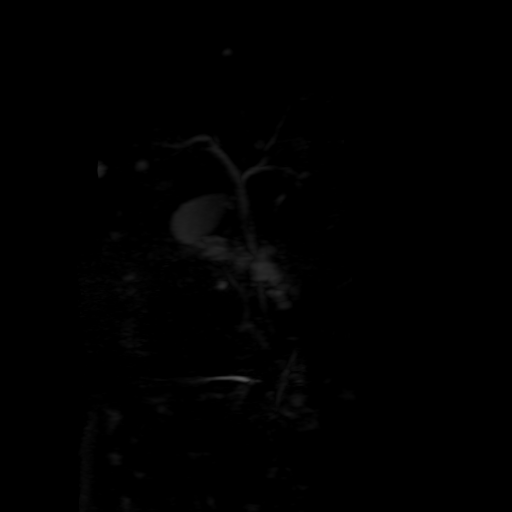
[im 181/181]
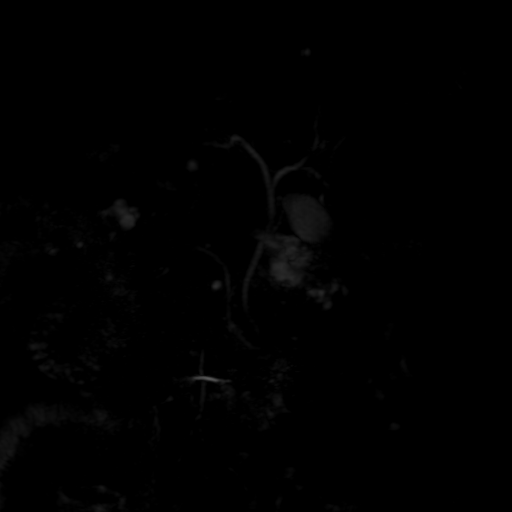

[21 of 48 positions shown; findings below may reference images not displayed]

FINDINGS: Lower chest: No acute findings.

Hepatobiliary: Mild diffuse hepatic steatosis seen on chemical shift
imaging. Several small cysts are seen in both the right and left
hepatic lobes, however no liver masses are identified. Gallbladder
is unremarkable. No evidence of biliary ductal dilatation.

Pancreas: A 1.7 x 1.4 cm cystic lesion is seen in the pancreatic
body which contains thin internal septations, but no evidence of
solid or enhancing component. This shows probable communication with
the main pancreatic duct, although there is no evidence of
pancreatic ductal dilatation. A 6 mm simple appearing cyst is seen
in the pancreatic head measuring 6 mm. No evidence of pancreatic
divisum.

Spleen:  Within normal limits in size and appearance.

Adrenals/Urinary Tract: Normal adrenal glands. Several tiny simple
cysts are seen in both kidneys. A small benign Bosniak category 2
cyst is also seen in the midpole of the right kidney. No evidence of
hydronephrosis involving either kidney.

A 2.3 x 1.9 cm lesion is seen in the lateral upper pole of the right
kidney which shows mild heterogeneous contrast enhancement on
subtraction imaging, suspicious for renal cell carcinoma.

Stomach/Bowel: Visualized portion unremarkable.

Vascular/Lymphatic: No pathologically enlarged lymph nodes
identified. No abdominal aortic aneurysm.

Other:  None.

Musculoskeletal:  No suspicious bone lesions identified.
IMPRESSION: 2.3 cm lesion in upper pole of right kidney, suspicious for renal
cell carcinoma. No evidence of abdominal metastatic disease.

1.7 cm cystic lesion in pancreatic body and 6 mm cystic lesion in
pancreatic head, suspicious for indolent cystic neoplasms such as
side-branch IPMNs. Continued follow-up of these lesions by MRI is
recommended in 1 year. This recommendation follows ACR consensus
guidelines: Management of Incidental Pancreatic Cysts: A White Paper
of the ACR Incidental Findings Committee. [HOSPITAL]

Mild hepatic steatosis.

## 2023-02-19 ENCOUNTER — Encounter: Payer: Self-pay | Admitting: Family Medicine

## 2023-02-20 ENCOUNTER — Other Ambulatory Visit (HOSPITAL_COMMUNITY): Payer: Self-pay | Admitting: Family Medicine

## 2023-02-20 DIAGNOSIS — N2889 Other specified disorders of kidney and ureter: Secondary | ICD-10-CM

## 2023-12-26 NOTE — Telephone Encounter (Signed)
 12/26/2023 12:01 PM  EMR Reviewed  Patient recently discharged from: [facility]  Discharge Survey Flag(s):  Discharge Instructions    Call Outcome: survey not conducted   Outcome: Message (voicemail or person)   Narrative Notes Adult D/C SURVEY DAY 1  EMR reviewed.  Upon RN call back, no answer, reached voicemail.  Message left to call Patient Discharge Line [phone] if any questions or concerns.       Electronically signed by : [name], RN 12/26/2023 12:01 PM

## 2023-12-26 NOTE — Telephone Encounter (Signed)
 Called patient for 48-hour callback.  Patient is doing well checking vital signs and taking daily weights.  All vital signs and weights are within normal limits.  Patient has not been taking a daily shower, and had left dressing on drain sites until today.  Patient reports drain site is very irritated and red.  I explained that he needed to take a daily antibacterial shower and that redness should improve if he washes admitted.  I reiterated that he needs to keep all sites open to air as that would be the best way for healing.  Patient is ambulating and doing breathing exercises as instructed.  We discussed follow-up appointments and contact information.  Patient was very appreciative of call.

## 2023-12-27 NOTE — Telephone Encounter (Signed)
 Pt called to report that his bp is running low today. Lowest bp was 83/45 to 95/47 with HR 70-72. No dizziness or pre syncope. No SOB. His weight is 189-190 which appears to be a 5# weight loss since discharge 3 days ago. He is able to walk well and feels pretty good per his description but he has been taking his bp every 30 min most of the day. He did take Lasix today because he had some right leg swelling. Of note this is his SVG harvest leg. Explained to pt that some swelling is to be expected but he can take Lasix when he has swelling in both legs or a weight gain of 3 pounds in 1 day or 5 pounds in a week.  Cautioned him to not take Lasix if his SBP is in the 90s or less.  Spoke with floor provider Harlene Senters, [place]; she would like patient to drink 8 to 12 ounces of Gatorade today and to hold his lisinopril 20 mg for now.  Instructions given to patient.  Explained that the plan would be for him to start back lisinopril at half the dose when SBP is 130s but asked that he call the office before doing.  Asked him to not take his blood pressure every 30 minutes but to just check it 1 more time before going to bed and insurance given that this is not unusual and it is likely because he lost 5 pounds rather quickly.  Also encouragement given that everything else looks really good so this should improve with the med change and Gatorade.  Assured that he had we can number to call if he had any questions.  Encouraged him to do so

## 2023-12-30 ENCOUNTER — Telehealth (HOSPITAL_COMMUNITY): Payer: Self-pay

## 2023-12-30 NOTE — Telephone Encounter (Signed)
 Outside/paper referral received by Dr. Burnie from [facility]. Will fax over request for 12 lead EKG and possibly office notes. Insurance benefits and eligibility to be determined.

## 2023-12-31 ENCOUNTER — Telehealth (HOSPITAL_COMMUNITY): Payer: Self-pay

## 2023-12-31 NOTE — Telephone Encounter (Signed)
 Called patient to see if he is interested in the Cardiac Rehab Program. Patient expressed interest. Explained scheduling process and went over insurance, patient verbalized understanding.   Requested MD order and 12 lead from [facility]. Patient stated he will request VA authorization from his [place] cardiologist.

## 2024-01-03 ENCOUNTER — Telehealth (HOSPITAL_COMMUNITY): Payer: Self-pay

## 2024-01-03 NOTE — Telephone Encounter (Signed)
 MD order received from Dr. Lanie office, no EKG. Will re-fax request for EKG.

## 2024-01-06 NOTE — Progress Notes (Addendum)
 [facility]  [facility] Cardiothoracic Surgery   Post operative clinic visit    Francisco Mckinney a 78 y.o. year old male.  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 He called our office because he is having trouble sleeping and he has a sore spot on his back. The sore spot on his back looks like a skin burn from tape removal.  There is a minimal amount of erythema and there is no drainage, does not look infected but it is painful for him. He also complains of difficulty sleeping and he took melatonin once and that worked and all the other times he is taken it has not worked.  He otherwise is doing well.  He is getting around well.  He still has some complaints of soreness when he moves.  He is currently on Eliquis and not amiodarone.  Preoperatively he had a pacemaker interrogation in June 2025 showing some atrial fibrillation and was started on Eliquis at that time.  It was restarted postoperatively.  HEENT: Atraumatic, normocephalic Lungs: Clear to auscultation bilaterally with good breath sounds in the bases Cardiac: Regular rate and rhythm, no murmurs, no rubs or gallops Extremities: No edema Psych: Normal mood and affect  Incisions: healing well without evidence of infection. BP 123/80   Pulse 71   Current Outpatient Medications  Medication Instructions  . alirocumab (PRALUENT) 75 mg, Every 14 days  . allopurinoL (ZYLOPRIM) 300 mg, Daily  . apixaban (ELIQUIS) 5 mg, 2 times daily  . aspirin 81 mg, Daily  . [Paused] carvediloL (COREG) 6.25 mg, 2 times daily  . cholecalciferol (VITAMIN D3) 400 Units, Daily  . cholestyramine 4 gram powd TAKE 1 SCOOPFUL (4GM) BY MOUTH DAILY FOR DIARRHEA (MIX IN 6 OUNCES OF WATER OR NON-CARBONATED BEVERAGE AND DRINK)  . diphenhydrAMINE (BENADRYL) 25 mg, 3 times daily PRN  . ezetimibe (ZETIA)  10 mg, Daily  . furosemide (LASIX) 40 mg tablet Take one tablet daily as needed for lower extremity swelling or weight gain >3 pounds in 1 day or >5 pounds in 1 week  . levothyroxine (SYNTHROID) 137 mcg, Daily  . [Paused] lisinopriL (PRINIVIL) 20 mg, oral, Daily  . magnesium oxide 800 mg, Daily  . metFORMIN (GLUCOPHAGE) 500 mg, oral, 2 times daily  . metoprolol tartrate (LOPRESSOR) 12.5 mg, oral, 2 times daily  . pantoprazole (PROTONIX) 40 mg, oral, Every morning before breakfast  . polyethylene glycol (MIRALAX) 17 g, oral, Daily  . rosuvastatin (CRESTOR) 2.5 mg, Daily  . spironolactone (ALDACTONE) 12.5 mg, Daily  . traMADoL (ULTRAM) 50 mg, oral, Every 6 hours PRN  . zolpidem (AMBIEN) 5 mg, oral, At  bedtime PRN     Assessment: 78 y.o. year old male status post CABG x 4  via sternotomy on December 20, 2023 He has a area on his back that looks like a tape burn that does not look infected. He is having difficulty sleeping.  Plan:  - Ambien 5 mg, patient instructed that if he would have any confusion he needs to discontinue it. -Have told the patient to try sleeping in a recliner rather than flat in bed - Wash his incision and his back daily with soap and water.  Dry dressing to his back - Continue to increase his walking - He has ongoing problems with his sleep he has been told to call his PCP  [name], PA-C 01/06/2024 12:05 PM

## 2024-02-13 ENCOUNTER — Telehealth (HOSPITAL_COMMUNITY): Payer: Self-pay

## 2024-02-13 NOTE — Telephone Encounter (Signed)
 EKG received from Dr. Burnie.

## 2024-02-17 ENCOUNTER — Emergency Department (HOSPITAL_COMMUNITY)
Admission: EM | Admit: 2024-02-17 | Discharge: 2024-02-17 | Disposition: A | Discharge status: Home / Self Care | Attending: Emergency Medicine | Admitting: Emergency Medicine

## 2024-02-17 ENCOUNTER — Other Ambulatory Visit: Payer: Self-pay

## 2024-02-17 ENCOUNTER — Encounter (HOSPITAL_COMMUNITY): Payer: Self-pay

## 2024-02-17 DIAGNOSIS — I2510 Atherosclerotic heart disease of native coronary artery without angina pectoris: Secondary | ICD-10-CM | POA: Diagnosis not present

## 2024-02-17 DIAGNOSIS — E119 Type 2 diabetes mellitus without complications: Secondary | ICD-10-CM | POA: Insufficient documentation

## 2024-02-17 DIAGNOSIS — M21372 Foot drop, left foot: Secondary | ICD-10-CM | POA: Insufficient documentation

## 2024-02-17 DIAGNOSIS — I10 Essential (primary) hypertension: Secondary | ICD-10-CM | POA: Diagnosis not present

## 2024-02-17 DIAGNOSIS — E039 Hypothyroidism, unspecified: Secondary | ICD-10-CM | POA: Insufficient documentation

## 2024-02-17 LAB — COMPREHENSIVE METABOLIC PANEL WITH GFR
ALT: 9 U/L (ref 0–44)
AST: 19 U/L (ref 15–41)
Albumin: 3.5 g/dL (ref 3.5–5.0)
Alkaline Phosphatase: 63 U/L (ref 38–126)
Anion gap: 14 (ref 5–15)
BUN: 22 mg/dL (ref 8–23)
CO2: 24 mmol/L (ref 22–32)
Calcium: 10 mg/dL (ref 8.9–10.3)
Chloride: 104 mmol/L (ref 98–111)
Creatinine, Ser: 1.38 mg/dL — ABNORMAL HIGH (ref 0.61–1.24)
GFR, Estimated: 52 mL/min — ABNORMAL LOW (ref >60)
Glucose, Bld: 138 mg/dL — ABNORMAL HIGH (ref 70–99)
Potassium: 4.4 mmol/L (ref 3.5–5.1)
Sodium: 142 mmol/L (ref 135–145)
Total Bilirubin: 0.4 mg/dL (ref 0.0–1.2)
Total Protein: 6.7 g/dL (ref 6.5–8.1)

## 2024-02-17 LAB — CBC WITH DIFFERENTIAL/PLATELET
Abs Immature Granulocytes: 0.02 K/uL (ref 0.00–0.07)
Basophils Absolute: 0.1 K/uL (ref 0.0–0.1)
Basophils Relative: 1 %
Eosinophils Absolute: 0.4 K/uL (ref 0.0–0.5)
Eosinophils Relative: 6 %
HCT: 37.7 % — ABNORMAL LOW (ref 39.0–52.0)
Hemoglobin: 11.5 g/dL — ABNORMAL LOW (ref 13.0–17.0)
Immature Granulocytes: 0 %
Lymphocytes Relative: 32 %
Lymphs Abs: 2.1 K/uL (ref 0.7–4.0)
MCH: 29.3 pg (ref 26.0–34.0)
MCHC: 30.5 g/dL (ref 30.0–36.0)
MCV: 96.2 fL (ref 80.0–100.0)
Monocytes Absolute: 0.5 K/uL (ref 0.1–1.0)
Monocytes Relative: 7 %
Neutro Abs: 3.5 K/uL (ref 1.7–7.7)
Neutrophils Relative %: 54 %
Platelets: 272 K/uL (ref 150–400)
RBC: 3.92 MIL/uL — ABNORMAL LOW (ref 4.22–5.81)
RDW: 14 % (ref 11.5–15.5)
WBC: 6.5 K/uL (ref 4.0–10.5)
nRBC: 0 % (ref 0.0–0.2)

## 2024-02-17 LAB — I-STAT CG4 LACTIC ACID, ED: Lactic Acid, Venous: 2 mmol/L (ref 0.5–1.9)

## 2024-02-17 NOTE — ED Triage Notes (Signed)
 PT sent by [facility] for an MRi of back and foot.

## 2024-02-17 NOTE — ED Notes (Signed)
 Awaiting patient from lobby.

## 2024-02-17 NOTE — Discharge Instructions (Addendum)
 It was a pleasure taking care of you today.  After speaking with the specialist it was recommended for you to have a CT scan of your head, at this time you are declining this test.  Please understand that you may return to the emergency department at any time to have this test completed.  You have been given an outpatient referral to neurology, they should call you within the next 2 to 3 days to schedule an appointment, if you do not receive a call please reach out to the office to schedule an appointment.  If you experience any of the following symptoms including but not limited to severe headache, visual disturbances, worsening unexplained weakness, falls, unexplained numbness/tingling, facial droop, slurred speech, or other concerning symptom please return to the emergency department or seek further medical care.  Please follow-up with your primary care provider and other specialist as scheduled, sooner if symptoms warrant.

## 2024-02-17 NOTE — ED Triage Notes (Signed)
 Pt c.o numbness to his left foot for 10-12 days. Pt fell last week and injured his second big toe, wound noted to the second toe.

## 2024-02-17 NOTE — ED Notes (Signed)
 Upon meeting the patient he requested to be discharged and he would come back tomorrow. I explained to patient if he left and came back we would start at square one and he would be fine with that.

## 2024-02-17 NOTE — ED Provider Notes (Signed)
 [facility] Provider Note   CSN: [id number] Arrival date & time: 02/17/24  1420     Patient presents with: Numbness   Francisco Mckinney is a 78 y.o. male who presents emergency department with a chief complaint of decree sensation of his left lower extremity specifically on the bottom of his left foot as well as decreased strength in his left foot.  Patient states that this has been going on for approximately 10 to 12 days and that he had a fall last week due to this.  [facility] to [facility] department due to acute left foot drop and decreased sensation.  Of note patient recently had a pac[facility] replaced on [facility]/11/[facility]24 at [facility].  Denies fever, chills, chest pain, shortness of breath, abdominal pain, nausea, vomiting.  He denies saddle anesthesia or groin numbness, urinary retention or incontinence, bowel retention or incontinence.  Patient has a past medical history significant for coronary artery disease, hypertension, hyperlipidemia, hypothyroidism, PTSD, gout, aneurysm of splenic artery, diabetes, etc. denies facial droop, slurred speech, upper extremity weakness.  Denies bowel/urinary retention or incontinence, denies groin numbness or tingling.  {Add pertinent medical, surgical, social history, OB history to HPI:32947} HPI     Prior to Admission medications   Not on File    Allergies: Antihistamines, diphenhydramine-type and Wasp venom    Review of Systems  Neurological:  Positive for weakness.    Updated Vital Signs BP 139/76   Pulse 79   Temp 98 F (36.7 C)   Resp 14   Ht 5' 11 (1.803 m)   Wt 80.7 kg   SpO2 98%   BMI 24.83 kg/m   Physical Exam Vitals and nursing note reviewed.  Constitutional:      General: He is awake. He is not in acute distress.    Appearance: Normal appearance. He is not ill-appearing, toxic-appearing or diaphoretic.  HENT:     Head: Normocephalic and  atraumatic.  Eyes:     General: No visual field deficit or scleral icterus.    Extraocular Movements: Extraocular movements intact.     Pupils: Pupils are equal, round, and reactive to light.     Comments: Gaze misaligned  Cardiovascular:     Rate and Rhythm: Normal rate and regular rhythm.  Pulmonary:     Effort: Pulmonary effort is normal. No respiratory distress.     Breath sounds: Normal breath sounds. No wheezing, rhonchi or rales.  Musculoskeletal:     Right lower leg: No edema.     Left lower leg: No edema.     Comments: Reduced dorsiflexion of left foot, patient is able to invert left foot, symmetrical sensation of plantar surface of foot as well as dorsum of foot, plantarflexion of bilateral lower extremities symmetrical  Patient is ambulatory without assistance  Patellar reflexes intact and symmetrical  Symmetrical grip strength of bilateral upper extremities, symmetrical strength against resistance of bilateral upper extremities  Patient able to move all toes of left foot, second toe of left foot mildly tender to palpation    Skin:    General: Skin is warm.     Capillary Refill: Capillary refill takes less than 2 seconds.     Comments: Bruising present to second toe of the left foot  Neurological:     General: No focal deficit present.     Mental Status: He is alert and oriented to person, place, and time.  GCS: GCS eye subscore is 4. GCS verbal subscore is 5. GCS motor subscore is 6.     Cranial Nerves: No dysarthria or facial asymmetry.     Sensory: Sensation is intact.     Coordination: Coordination is intact.  Psychiatric:        Mood and Affect: Mood normal.        Behavior: Behavior normal. Behavior is cooperative.     (all labs ordered are listed, but only abnormal results are displayed) Labs Reviewed  COMPREHENSIVE METABOLIC PANEL WITH GFR - Abnormal; Notable for the following components:      Result Value   Glucose, Bld 138 (*)    Creatinine, Ser  1.38 (*)    GFR, Estimated 52 (*)    All other components within normal limits  CBC WITH DIFFERENTIAL/PLATELET - Abnormal; Notable for the following components:   RBC 3.92 (*)    Hemoglobin 11.5 (*)    HCT 37.7 (*)    All other components within normal limits  I-STAT CG4 LACTIC ACID, ED - Abnormal; Notable for the following components:   Lactic Acid, Venous 2.0 (*)    All other components within normal limits    EKG: None  Radiology: No results found.  {Document cardiac monitor, telemetry assessment procedure when appropriate:32947} Procedures   Medications Ordered in the ED - No data to display    {Click here for ABCD2, HEART and other calculators REFRESH Note before signing:1}                              Medical Decision Making Amount and/or Complexity of Data Reviewed Labs: ordered.   Patient presents to the ED for concern of left foot drop, this involves an extensive number of treatment options, and is a complaint that carries with it a high risk of complications and morbidity.  The differential diagnosis includes peroneal nerve issue, L5 radiculopathy, stroke, brain bleed, malignancy, etc.   Co morbidities that complicate the patient evaluation  coronary artery disease, hypertension, hyperlipidemia, hypothyroidism, PTSD, gout, aneurysm of splenic artery, diabetes   Additional history obtained:  Additional history obtained from [facility] notes that were provided by the patient, notes state that patient was sent over for acute left foot drop   Lab Tests:  I Ordered, and personally interpreted labs.  The pertinent results include: CBC significant for slightly decreased hemoglobin of 11.5, CMP shows creatinine of 1.38, lactic acid of 2   Imaging Studies ordered:  I ordered imaging studies including CT head *Patient left the emergency department prior to completing CT head imaging* I agree with the radiologist interpretation   Cardiac Monitoring:  The patient was  maintained on a cardiac monitor.  I personally viewed and interpreted the cardiac monitored which showed an underlying rhythm of: Paced rhythm   Medicines ordered and prescription drug management: I have reviewed the patients home medicines and have made adjustments as needed   Test Considered:  MRI brain, MRI lumbar spine: Declined at this time as [facility] with [facility], at this time he is not a candidate for MRI because of epicardial lead on his pacemaker   Critical Interventions:  None   Consultations Obtained:  I requested consultation with the neurologist Dr. Kahliqdina and discussed lab and imaging findings as well as pertinent plan - they recommend:    Problem List / ED Course:  78 year old male, 10 to 12 days of new onset left foot drop, no  changes in sensation by physical exam today, patellar reflexes intact, no upper extremity weakness, chronically misaligned gaze, EOMs intact, no visual disturbances, send read by [facility] for a possible MRI On further assessment patient had a pacemaker placed on 02/11/2024, cardiology is currently recommending no MRI at this time as patient has an epicardial lead related to his pacemaker Consulted Dr. Sal Khaliqdina with neurology who recommends CT of head to rule out possible stroke, otherwise patient can follow-up outpatient with neurology for outpatient EMG CT head ordered, will reassess Of note patient does have a injury noted to his second toe of his left foot, patient states that the [place] x-rayed this and no acute injuries were noted including fractures Patient stating that he would like to leave the emergency department at this time prior to imaging, I informed the patient that I spoke with the specialist who thinks imaging is warranted at this time.  Patient understanding of this and understands risks of leaving including worsening of symptoms, death, etc.  Patient also declines splint that was ordered to help with foot drop at this time.   Patient states that he will just follow-up with neurology outpatient. I have provided the patient with discharge instructions as well as an ambulatory referral to neurology.  Return precautions given Patient discharged Most likely diagnosis at this time is peroneal nerve issue versus L5 radiculopathy, doubt acute stroke based off of symptoms however could not rule out as patient left prior to getting CT head, patient is also not a candidate for MRI at this time, patient given outpatient neurology follow-up as he elected to leave prior to complete workup   Reevaluation:  After the interventions noted above, I reevaluated the patient and found that they have :{resolved/improved/worsened:23923::improved}   Social Determinants of Health:  ***   Dispostion:  After consideration of the diagnostic results and the patients response to treatment, I feel that the patent would benefit from ***.    {Document critical care time when appropriate  Document review of labs and clinical decision tools ie CHADS2VASC2, etc  Document your independent review of radiology images and any outside records  Document your discussion with family members, caretakers and with consultants  Document social determinants of health affecting pt's care  Document your decision making why or why not admission, treatments were needed:32947:::1}   Final diagnoses:  None    ED Discharge Orders     None

## 2024-02-26 ENCOUNTER — Telehealth (HOSPITAL_COMMUNITY): Payer: Self-pay

## 2024-02-26 NOTE — Telephone Encounter (Signed)
 Called patient to confirm insurance, he states he has [facility] authorization. Informed him we have not received that [place] auth yet, he states he will reach out to [facility] so they can send that to us .

## 2024-03-20 NOTE — Telephone Encounter (Signed)
 Called patient to review labs - unable to reach and voicemail left.  I reached out to his cardiology team at [facility] to discuss complaints yesterday and they were going to schedule him to come into clinic today  Labs show stable kidney function eGFR 57ml/min and normal electrolytes NT ProBNP was fairly elevated just over 10k. I do think he needs to be on a daily diuretic and would have strong consideration for SGLT2i  Will continue to reach out to the cardiology team and [facility] so we can coordinate care

## 2024-03-23 ENCOUNTER — Encounter: Payer: Self-pay | Admitting: Diagnostic Neuroimaging

## 2024-03-23 ENCOUNTER — Ambulatory Visit (INDEPENDENT_AMBULATORY_CARE_PROVIDER_SITE_OTHER): Admitting: Diagnostic Neuroimaging

## 2024-03-23 ENCOUNTER — Telehealth: Payer: Self-pay | Admitting: Diagnostic Neuroimaging

## 2024-03-23 VITALS — BP 126/72 | HR 76 | Ht 71.0 in | Wt 191.0 lb

## 2024-03-23 DIAGNOSIS — R292 Abnormal reflex: Secondary | ICD-10-CM

## 2024-03-23 DIAGNOSIS — R29898 Other symptoms and signs involving the musculoskeletal system: Secondary | ICD-10-CM | POA: Diagnosis not present

## 2024-03-23 NOTE — Progress Notes (Signed)
 [facility]  PATIENT: Francisco Mckinney DOB: [birth year]  REFERRING CLINICIAN: Janetta Terrall FALCON, PA-C HISTORY FROM: patient  REASON FOR VISIT: new consult   HISTORICAL  CHIEF COMPLAINT:  Chief Complaint  Patient presents with   foot drop    Rm 7 alone  - left leg / foot weakness    HISTORY OF PRESENT ILLNESS:   78 year old male here for evaluation of left lower extremity weakness.  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  Patient presents for evaluation after noticing left leg problem when he went to the hospital for a left pacemaker replacement on 02/11/2024.  He noticed slight dripping of his left leg before the procedure had occurred.  Symptoms continued over time.  He went to the [facility] for evaluation and they recommended he go to the emergency room to get an MRI of his lower back and foot.  He was diagnosed with a left foot drop weakness.  However MRI was deferred due to history of pacemaker and apparently epicardial leads that prohibited MRI testing.  Patient now presents for further evaluation.  He denies any problems with right leg or upper extremities.  He denies any numbness or tingling.  He has been using a walker to help with ambulation.   REVIEW OF SYSTEMS: Full 14 system review of systems performed and negative with exception of: As per HPI.  ALLERGIES: Allergies  Allergen Reactions   Antihistamines, Diphenhydramine-Type    Wasp Venom     HOME MEDICATIONS: Outpatient Medications Prior to Visit  Medication Sig Dispense Refill   allopurinol (ZYLOPRIM) 300 MG tablet Take 300 mg by mouth.     apixaban (ELIQUIS) 5 MG TABS tablet Take 5 mg by mouth.     carvedilol (COREG) 12.5 MG tablet Take 6.25 mg by mouth 2 (two) times daily.     Cholecalciferol (VITAMIN D3) 10 MCG (400 UNIT) tablet Take 400 Units by mouth daily.     cholestyramine (QUESTRAN) 4 GM/DOSE powder Take by mouth as needed.      ezetimibe (ZETIA) 10 MG tablet Take 10 mg by mouth daily.     ferrous sulfate 325 (65 FE) MG tablet Take 325 mg by mouth daily.     isosorbide mononitrate (IMDUR) 30 MG 24 hr tablet Take 30 mg by mouth daily.     levothyroxine (SYNTHROID) 137 MCG tablet Take 137 mcg by mouth daily before breakfast.     magnesium oxide (MAG-OX) 400 MG tablet Take 800 mg by mouth daily.     metFORMIN (GLUCOPHAGE) 500 MG tablet Take 500 mg by mouth 2 (two) times daily with a meal.     mirtazapine (REMERON) 15 MG tablet Take 7.5 mg by mouth at bedtime.     omeprazole (PRILOSEC) 40 MG capsule Take 40 mg by mouth daily.     potassium chloride (MICRO-K) 10 MEQ CR capsule Take 10 mEq by mouth daily.     rosuvastatin (CRESTOR) 5 MG tablet Take 2.5 mg by mouth daily.     sacubitril-valsartan (ENTRESTO) 24-26 MG Take 1 tablet by mouth 2 (two) times daily.     spironolactone (ALDACTONE) 25 MG tablet Take 12.5 mg by mouth daily.     vitamin E 1000 UNIT capsule Take 1,000 Units by mouth 2 (two) times daily.     No facility-administered medications prior to visit.    PAST MEDICAL HISTORY: Past Medical History:  Diagnosis Date   Family history of pancreatic cancer  Family history of pancreatic cancer     PAST SURGICAL HISTORY: History reviewed. No pertinent surgical history.  FAMILY HISTORY: Family History  Problem Relation Age of Onset   Pancreatic cancer Mother 26   Other Brother        cyst in pancreas   Pancreatic cancer Maternal Grandmother 70    SOCIAL HISTORY: Social History   Socioeconomic History   Marital status: Single    Spouse name: Not on file   Number of children: Not on file   Years of education: Not on file   Highest education level: Not on file  Occupational History   Not on file  Tobacco Use   Smoking status: Never   Smokeless tobacco: Never  Substance and Sexual Activity   Alcohol use: Not Currently   Drug use: Not Currently   Sexual activity: Not Currently  Other Topics  Concern   Not on file  Social History Narrative   Not on file   Social Drivers of Health   Financial Resource Strain: Low Risk  (10/03/2023)   Received from [facility]   Overall Financial Resource Strain (CARDIA)    Difficulty of Paying Living Expenses: Not hard at all  Food Insecurity: Low Risk  (11/28/2023)   Received from [facility]   Hunger Vital Sign    Within the past 12 months, you worried that your food would run out before you got money to buy more: Never true    Within the past 12 months, the food you bought just didn't last and you didn't have money to get more. : Never true  Transportation Needs: No Transportation Needs (11/28/2023)   Received from [facility]    In the past 12 months, has lack of reliable transportation kept you from medical appointments, meetings, work or from getting things needed for daily living? : No  Physical Activity: Not on file  Stress: Stress Concern Present (05/10/2023)   Received from [facility] - Occupational Stress Questionnaire    Feeling of Stress : To some extent  Social Connections: Unknown (10/23/2021)   [facility]   Social Network    Social Network: Not on file  Intimate Partner Violence: Not At Risk (05/10/2023)   Received from [facility]   HITS    Over the last 12 months how often did your partner physically hurt you?: Never    Over the last 12 months how often did your partner insult you or talk down to you?: Never    Over the last 12 months how often did your partner threaten you with physical harm?: Never    Over the last 12 months how often did your partner scream or curse at you?: Never     PHYSICAL EXAM  GENERAL EXAM/CONSTITUTIONAL: Vitals:  Vitals:   03/23/24 1014  BP: 126/72  Pulse: 76  Weight: 191 lb (86.6 kg)  Height: 5' 11 (1.803 m)   Body mass index is 26.64 kg/m. Wt Readings from Last 3 Encounters:  03/23/24 191  lb (86.6 kg)  02/17/24 178 lb (80.7 kg)   Patient is in no distress; well developed, nourished and groomed; neck is supple  CARDIOVASCULAR: Examination of carotid arteries is normal; no carotid bruits Regular rate and rhythm, no murmurs Examination of peripheral vascular system by observation and palpation is normal  EYES: Ophthalmoscopic exam of optic discs and posterior segments is normal; no papilledema or hemorrhages No results found.  MUSCULOSKELETAL: Gait, strength, tone, movements noted in Neurologic exam below  NEUROLOGIC: MENTAL STATUS:      No data to display         awake, alert, oriented to person, place and time recent and remote memory intact normal attention and concentration language fluent, comprehension intact, naming intact fund of knowledge appropriate  CRANIAL NERVE:  2nd - no papilledema on fundoscopic exam 2nd, 3rd, 4th, 6th - pupils 2mm min reaction (post surgical), visual fields full to confrontation, extraocular muscles intact, no nystagmus 5th - facial sensation symmetric 7th - facial strength symmetric 8th - hearing intact 9th - palate elevates symmetrically, uvula midline 11th - shoulder shrug symmetric 12th - tongue protrusion midline  MOTOR:  normal bulk and tone BUE DELTOIDS 4+ OTHERWISE 5 RLE HF 4, KE / KF 4+, DF 5 LLE HF 3, KE 3, KF 4, DF 3  SENSORY:  normal and symmetric to light touch, temperature, vibration  COORDINATION:  finger-nose-finger, fine finger movements normal  REFLEXES:  deep tendon reflexes 2+; 3+ at knees; 1+ at ankles  GAIT/STATION:  USING WALKER; UNSTEADY GAIT WITH LEFT LEG WEAKNESS      DIAGNOSTIC DATA (LABS, IMAGING, TESTING) - I reviewed patient records, labs, notes, testing and imaging myself where available.  Lab Results  Component Value Date   WBC 6.5 02/17/2024   HGB 11.5 (L) 02/17/2024   HCT 37.7 (L) 02/17/2024   MCV 96.2 02/17/2024   PLT 272 02/17/2024      Component Value Date/Time    NA 142 02/17/2024 1453   K 4.4 02/17/2024 1453   CL 104 02/17/2024 1453   CO2 24 02/17/2024 1453   GLUCOSE 138 (H) 02/17/2024 1453   BUN 22 02/17/2024 1453   CREATININE 1.38 (H) 02/17/2024 1453   CALCIUM 10.0 02/17/2024 1453   PROT 6.7 02/17/2024 1453   ALBUMIN 3.5 02/17/2024 1453   AST 19 02/17/2024 1453   ALT 9 02/17/2024 1453   ALKPHOS 63 02/17/2024 1453   BILITOT 0.4 02/17/2024 1453   GFRNONAA 52 (L) 02/17/2024 1453   GFRAA >60 12/29/2019 1108   No results found for: CHOL, HDL, LDLCALC, LDLDIRECT, TRIG, CHOLHDL No results found for: YHAJ8R No results found for: VITAMINB12 No results found for: TSH     ASSESSMENT AND PLAN  78 y.o. year old male here with:   Dx:  1. Left leg weakness   2. Hyper-reflexia       PLAN:  Left > right leg weakness + hyperreflexia (upper and lower ext)  - check MRI cervical spine (rule out myelopathy)  - check MRI lumbar spine (rule out spinal stenosis, lumbar radiculopathy)  - Pacemaker placement --> will see if these are MRI compatible DEVICE: Medtronic Serial Number: [id number] S Type: Percepta CRT-P [id number] DOI: 02/11/2024  ATRIAL LEAD:  Vendor: [employer] Serial Number: [id number] Type: 4076 CapSureFix Novus-65 cm DOI: 05/10/2023  RV LEAD: Medtronic Serial Number: [id number] Type: 5076 CapsureFix Novus MRI-65 cm DOI: 05/10/2023  LV LEAD: Medtronic  Serial Number: [id number] Type: 4965 Capsure Epi Unipolar-50 cm DOI: 12/20/2023   Orders Placed This Encounter  Procedures   MR CERVICAL SPINE WO CONTRAST   MR LUMBAR SPINE WO CONTRAST   Return for pending if symptoms worsen or fail to improve, pending test results.  I spent 60 minutes of face-to-face and non-face-to-face time with patient.  This included previsit chart review, lab review, study review, order entry, electronic health record documentation, patient education.    EDUARD FABIENE HANLON, MD 03/23/2024, 7:08 PM  Certified in Neurology,  Neurophysiology and Neuroimaging  [facility] [address] [place], [place] [address] [phone]

## 2024-03-23 NOTE — Telephone Encounter (Signed)
 [facility] auth# [id number] valid 02/26/2024 thru 03/23/2025 sent to [facility] [id number]

## 2024-03-24 NOTE — Telephone Encounter (Signed)
 [facility] radiology sent this back regarding scheduling his MRI: The cardiac system does not have MR conditional leads or configuration. There may be additional reasons the patient is ineligible for MRI.

## 2024-03-26 NOTE — Addendum Note (Signed)
 Addended by: MARGARET CARNE R on: 03/26/2024 08:38 AM   Modules accepted: Orders

## 2024-03-26 NOTE — Telephone Encounter (Signed)
 Changing to CT scans.   Orders Placed This Encounter  Procedures   CT CERVICAL SPINE W WO CONTRAST   CT LUMBAR SPINE WO CONTRAST    -VRP

## 2024-03-27 ENCOUNTER — Encounter (HOSPITAL_COMMUNITY): Payer: Self-pay

## 2024-03-30 NOTE — Telephone Encounter (Signed)
 Miriam @ [facility] sent the below message about CT scans.   For the Ct Cervical, we cannot do W/WO, can this be changed to WO, I can change it on my end and send for co-sign if that is okay?

## 2024-03-30 NOTE — Telephone Encounter (Signed)
 The CT scan orders have been changed and scheduled.

## 2024-04-02 ENCOUNTER — Other Ambulatory Visit

## 2024-04-03 ENCOUNTER — Telehealth (HOSPITAL_COMMUNITY): Payer: Self-pay

## 2024-04-03 NOTE — Telephone Encounter (Signed)
 Called pt to see if he is still interested in the cardiac rehab, pt stated yes and that he has been calling the [facility] and telling them to send over an auth to us  for cardiac rehab, I advised pt that we have not received the auth yet from the [place]. I advised pt to call them again today and once we receive it we will call him back to get him scheduled.

## 2024-04-07 ENCOUNTER — Other Ambulatory Visit

## 2024-04-11 DEATH — deceased
